# Patient Record
Sex: Female | Born: 1990 | Race: Black or African American | Hispanic: No | Marital: Single | Smoking: Never smoker
Health system: Southern US, Community
[De-identification: ages and names within clinical notes are randomized; demographics above are authoritative.]

## PROBLEM LIST (undated history)

## (undated) DIAGNOSIS — H8109 Meniere's disease, unspecified ear: Secondary | ICD-10-CM

---

## 2013-07-22 ENCOUNTER — Ambulatory Visit (INDEPENDENT_AMBULATORY_CARE_PROVIDER_SITE_OTHER): Payer: 59 | Admitting: Podiatry

## 2013-07-22 ENCOUNTER — Encounter: Payer: Self-pay | Admitting: Podiatry

## 2013-07-22 DIAGNOSIS — M21969 Unspecified acquired deformity of unspecified lower leg: Secondary | ICD-10-CM

## 2013-07-22 DIAGNOSIS — M216X9 Other acquired deformities of unspecified foot: Secondary | ICD-10-CM

## 2013-07-22 DIAGNOSIS — M25579 Pain in unspecified ankle and joints of unspecified foot: Secondary | ICD-10-CM

## 2013-07-22 NOTE — Progress Notes (Signed)
Subjective: 22 y.o. year old female patient presents with her mother complaining of pain on left foot arch and ankle area. Patient performs and on feet much. She danced throughout school years. Patient is wearing thin flat shoes. Patient lives in [place] and comes down to [place] every 2-3 month.   Objective: Dermatologic: No abnormal findings. Vascular: Pedal pulses are all palpable. Orthopedic: Positive of hypermobile first ray. Excess STJ pronation left>right. Excess medial and plantar advancement of TNJ complex. Flattening medial longitudinal arch L>R.  Neurologic: All epicritic and tactile sensations grossly intact. Radiographic examination reveal elevated first ray, short first Metatarsal, STJ excess pronatory subluxation bilateral.  Assessment: STJ hyperpronation. 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 Pain on left foot and ankle.   Treatment: Reviewed clinical findings and available treatment options. Left foot and ankle placed in Ankle brace. Both feet were casted for Orthotics. Discussed all conservative treatment options as well as surgical procedure of STJ arthroereisis and Cotton osteotomy with bone graft.

## 2013-07-29 DIAGNOSIS — M216X9 Other acquired deformities of unspecified foot: Secondary | ICD-10-CM

## 2013-11-18 ENCOUNTER — Encounter: Payer: Self-pay | Admitting: Podiatry

## 2013-11-18 ENCOUNTER — Ambulatory Visit (INDEPENDENT_AMBULATORY_CARE_PROVIDER_SITE_OTHER): Payer: 59 | Admitting: Podiatry

## 2013-11-18 VITALS — BP 130/80 | HR 93 | Ht 63.0 in | Wt 143.0 lb

## 2013-11-18 DIAGNOSIS — M25579 Pain in unspecified ankle and joints of unspecified foot: Secondary | ICD-10-CM

## 2013-11-18 DIAGNOSIS — M216X9 Other acquired deformities of unspecified foot: Secondary | ICD-10-CM

## 2013-11-18 DIAGNOSIS — M21969 Unspecified acquired deformity of unspecified lower leg: Secondary | ICD-10-CM

## 2013-11-18 NOTE — Patient Instructions (Signed)
Seen for painful feet.  Reviewed other treatment options, Hyprocure procedure and Cotton osteotomy with bone graft on left foot. Surgery consent form reviewed.  Rubie will call and set up surgery date.

## 2013-11-18 NOTE — Progress Notes (Signed)
Subjective: Seen for pain on both feet. Used custom orthotics. Still experiencing pain and tingling on feet. She wears shoes that is made for Hyperpronation. This time she has moved to [place] to have her feet corrected.   Objective: Painful feet. Flattened arch with weight bearing. Forefoot varus bilateral.  Assessment: STJ hyperpronation bilateral. Forefoot varus bilateral. Aching feet even after wearing shoes with Custom orthotics.   Plan: Reviewed for STJ Arthroereisis left and Cotton osteotomy with bone graft left. Patient will call and set surgery date.

## 2013-11-24 DIAGNOSIS — Q664 Congenital talipes calcaneovalgus, unspecified foot: Secondary | ICD-10-CM

## 2013-11-24 DIAGNOSIS — Q666 Other congenital valgus deformities of feet: Secondary | ICD-10-CM

## 2013-11-24 HISTORY — PX: OSTEOTOMY: SHX137

## 2013-11-24 HISTORY — PX: OTHER SURGICAL HISTORY: SHX169

## 2013-11-29 ENCOUNTER — Encounter: Payer: Self-pay | Admitting: Podiatry

## 2013-11-29 ENCOUNTER — Ambulatory Visit (INDEPENDENT_AMBULATORY_CARE_PROVIDER_SITE_OTHER): Payer: 59 | Admitting: Podiatry

## 2013-11-29 VITALS — BP 137/74 | HR 99

## 2013-11-29 DIAGNOSIS — M216X9 Other acquired deformities of unspecified foot: Secondary | ICD-10-CM

## 2013-11-29 DIAGNOSIS — M21969 Unspecified acquired deformity of unspecified lower leg: Secondary | ICD-10-CM

## 2013-11-29 NOTE — Patient Instructions (Signed)
First post op visit. Doing well. Gone over crutch handling. Will try right foot surgery this coming week.

## 2013-11-29 NOTE — Progress Notes (Signed)
5 days post op since Cotton osteotomy with graft and STJ arthroereisis left foot. Came in accompanied by her mother and little brother. Patient denies fever or chills.  Stated that she is doing well. No problems with the cast. Using wheeled walker to get around.  Wishes to have the right foot surgery at this time.  Assessment: Left foot doing well following the surgery. Right foot has the same condition as the left foot; Excessive STJ pronation, Sagging midfoot with elevated first ray.   Plan: Consent form reviewed for Cotton osteotomy with bone graft and STJ arthroereisis right foot. Dispensed Crutches and demo done.

## 2013-12-05 DIAGNOSIS — Q664 Congenital talipes calcaneovalgus, unspecified foot: Secondary | ICD-10-CM

## 2013-12-05 DIAGNOSIS — Q666 Other congenital valgus deformities of feet: Secondary | ICD-10-CM

## 2013-12-05 HISTORY — PX: OSTEOTOMY: SHX137

## 2013-12-05 HISTORY — PX: OTHER SURGICAL HISTORY: SHX169

## 2013-12-12 ENCOUNTER — Encounter: Payer: 59 | Admitting: Podiatry

## 2013-12-14 ENCOUNTER — Encounter: Payer: Self-pay | Admitting: Podiatry

## 2013-12-14 ENCOUNTER — Ambulatory Visit (INDEPENDENT_AMBULATORY_CARE_PROVIDER_SITE_OTHER): Payer: 59 | Admitting: Podiatry

## 2013-12-14 DIAGNOSIS — Z9889 Other specified postprocedural states: Secondary | ICD-10-CM

## 2013-12-14 NOTE — Progress Notes (Signed)
Status post 5 days on right, 3 weeks on left, both had Cotton osteotomy with bone graft and STJ arthroereisis.  Patient presented via wheel chair accompanied by mother and brother with both feet casted.  Stated that she needed pain pills one every other days. She had experienced minimum pain. 423 week old left foot cast removed. Wound has healed well. Minimum edema noted.  Left lower limb placed back in fiber glass cast.

## 2013-12-14 NOTE — Patient Instructions (Signed)
Left lower limb cast replaced.  Wound healing is normal without any abnormal findings.

## 2013-12-15 ENCOUNTER — Encounter: Payer: Self-pay | Admitting: *Deleted

## 2013-12-26 ENCOUNTER — Encounter: Payer: Self-pay | Admitting: Podiatry

## 2013-12-26 ENCOUNTER — Ambulatory Visit (INDEPENDENT_AMBULATORY_CARE_PROVIDER_SITE_OTHER): Payer: 59 | Admitting: Podiatry

## 2013-12-26 VITALS — BP 125/84 | HR 92

## 2013-12-26 DIAGNOSIS — Z9889 Other specified postprocedural states: Secondary | ICD-10-CM

## 2013-12-26 NOTE — Progress Notes (Signed)
Post op visit. Patient presents via wheel chair after having done surgery on both feet. 3 weeks post op on right and 4 weeks on left.  Right cast removed and noted incision has healed well without edema or abnormal findings. Noted of maintained correction.  Right lower limb replaced with new cast.  No problem with left cast. We will change in 2 weeks. Patient may use walker or wheel chair.

## 2013-12-26 NOTE — Patient Instructions (Signed)
Right lower limb cast replaced. Surgical wound healed well with maintained correction.  May use walker or wheel chair.

## 2014-01-09 ENCOUNTER — Ambulatory Visit (INDEPENDENT_AMBULATORY_CARE_PROVIDER_SITE_OTHER): Payer: 59 | Admitting: Podiatry

## 2014-01-09 ENCOUNTER — Encounter: Payer: Self-pay | Admitting: Podiatry

## 2014-01-09 VITALS — BP 130/88 | HR 101

## 2014-01-09 DIAGNOSIS — M25579 Pain in unspecified ankle and joints of unspecified foot: Secondary | ICD-10-CM

## 2014-01-09 DIAGNOSIS — M216X9 Other acquired deformities of unspecified foot: Secondary | ICD-10-CM

## 2014-01-09 DIAGNOSIS — Z9889 Other specified postprocedural states: Secondary | ICD-10-CM

## 2014-01-09 DIAGNOSIS — M21969 Unspecified acquired deformity of unspecified lower leg: Secondary | ICD-10-CM

## 2014-01-09 NOTE — Patient Instructions (Signed)
Surgery follow up visit. Left cast removed and placed on CAM walker. Doing well with both feet. Return for right foot cast removal.

## 2014-01-09 NOTE — Progress Notes (Signed)
Patient presents using walker accompanied by her father and younger brother.  Stated that she wasn't having any pain and was not taking any pain medications. Been walking in cast.  6 week post op on left.  Cast removed left, replaced in CAM walker.  Wound healing is good left foot without complications.   Right foot is still in cast without any problems. Will remove cast on next visit.

## 2014-01-16 ENCOUNTER — Ambulatory Visit (INDEPENDENT_AMBULATORY_CARE_PROVIDER_SITE_OTHER): Payer: 59 | Admitting: Podiatry

## 2014-01-16 ENCOUNTER — Encounter: Payer: Self-pay | Admitting: Podiatry

## 2014-01-16 VITALS — BP 130/76 | HR 92

## 2014-01-16 DIAGNOSIS — M21969 Unspecified acquired deformity of unspecified lower leg: Secondary | ICD-10-CM

## 2014-01-16 DIAGNOSIS — M216X9 Other acquired deformities of unspecified foot: Secondary | ICD-10-CM

## 2014-01-16 DIAGNOSIS — M25579 Pain in unspecified ankle and joints of unspecified foot: Secondary | ICD-10-CM

## 2014-01-16 NOTE — Progress Notes (Signed)
6 week on right, 8 weeks on left post op both with Cotton and STJ arthroereisis. Came in using walker, right lower limb in cast, left in CAM walker. Cast removed on right. Wound is well healed without abnormal findings. Corrections maintained.   X-rays taken on both feet. Noted good approximation of graft. No changes from immediate post op.  Assessment: Normal post op wound healing.  Plan: May try regular tennis shoe on left.  Stay in CAM walker on right. Return in 3 weeks.

## 2014-01-16 NOTE — Patient Instructions (Signed)
Both feet are healing as scheduled.  May ambulate in CAM walker on right, may use regular shoe on left. Use walker as needed.  Return in 3 weeks.

## 2014-02-06 ENCOUNTER — Ambulatory Visit (INDEPENDENT_AMBULATORY_CARE_PROVIDER_SITE_OTHER): Payer: 59 | Admitting: Podiatry

## 2014-02-06 ENCOUNTER — Encounter: Payer: Self-pay | Admitting: Podiatry

## 2014-02-06 VITALS — BP 141/86 | HR 97

## 2014-02-06 DIAGNOSIS — M21969 Unspecified acquired deformity of unspecified lower leg: Secondary | ICD-10-CM

## 2014-02-06 NOTE — Progress Notes (Signed)
Walking in CAM walker without assistance. Doing well except some sharp and and some discomfort at times at lateral ankle area.  Well maintained post op bone position and STJ movement.   Assessment: Satisfactory recovery from bilateral foot surgery, Cotton and STJ arthroereisis.   Plan:  Start walking in regular tennis shoes with aid of ace bandage and increase ambulation in increment. Also use NSAIA as needed.  Return in one month.

## 2014-02-06 NOTE — Patient Instructions (Signed)
Post op check. Doing well. Ready to use regular shoe and increase ambulation.  Return in one month.

## 2014-02-13 ENCOUNTER — Telehealth: Payer: Self-pay | Admitting: *Deleted

## 2014-02-13 NOTE — Telephone Encounter (Signed)
Dr. Raynald KempSheard, Spero CurbAnny Ferguson has called this afternoon and wants to take some physical therapy so that makes sure she is walking correctly... [facility]

## 2014-03-14 ENCOUNTER — Encounter: Payer: Self-pay | Admitting: Podiatry

## 2014-03-14 ENCOUNTER — Ambulatory Visit (INDEPENDENT_AMBULATORY_CARE_PROVIDER_SITE_OTHER): Payer: 59 | Admitting: Podiatry

## 2014-03-14 VITALS — BP 126/80 | HR 95

## 2014-03-14 DIAGNOSIS — Z9889 Other specified postprocedural states: Secondary | ICD-10-CM

## 2014-03-14 NOTE — Progress Notes (Signed)
Subjective:  4 months following Cotton and STJ implant. Walking in Tennis shoes with Ace wrap.  Objective:  Range of motion is good on right, with well maintained correction at medial arch on both feet.  Some stiffness at STJ on left foot.  Able to walk in Tennis shoes without difficulty.  Assessment: Satisfactory post op progress. Maintained medial column correction with improved foot pain bilateral.  Plan: May change ace wrap with elastic ankle support.  Use orthotics in tennis shoes. No limitation in activity as long as it is well tolerated.  Return as needed.

## 2014-03-14 NOTE — Patient Instructions (Addendum)
4 month follow up. Doi ng well and made good progress. May change ace wrap with elastic ankle support.  Use orthotics in tennis shoes. No limitation in activity as long as it is well tolerated.  Return as needed.

## 2014-11-14 ENCOUNTER — Encounter: Payer: Self-pay | Admitting: Podiatry

## 2014-11-14 ENCOUNTER — Ambulatory Visit (INDEPENDENT_AMBULATORY_CARE_PROVIDER_SITE_OTHER): Payer: 59 | Admitting: Podiatry

## 2014-11-14 VITALS — BP 130/77 | HR 83

## 2014-11-14 DIAGNOSIS — Z9889 Other specified postprocedural states: Secondary | ICD-10-CM

## 2014-11-14 NOTE — Patient Instructions (Signed)
Seen for pain in left ankle when standing prolonged period. May benefit from increased exercise and range of motion. Return as needed.

## 2014-11-14 NOTE — Progress Notes (Signed)
23 year old female presents complaining of left ankle pain after been standing on feet long period. Also when moving certain way. As she standing more she noticed she is standing more on side and more pain on ankle. She got new pair shoes to help out with the left foot pain.   Objective: Normal range of STJ motion in pronation and supination.  Plantar flexed first ray maintained.  Assessment: Stiffness and pain with prolonged standing on left.  Plan: Advised to increase exercise and range of motion exercise.  Return as needed.

## 2014-11-17 ENCOUNTER — Ambulatory Visit: Payer: 59 | Admitting: Podiatry

## 2015-01-09 ENCOUNTER — Other Ambulatory Visit (HOSPITAL_COMMUNITY): Payer: Self-pay | Admitting: Ophthalmology

## 2015-01-09 DIAGNOSIS — H05223 Edema of bilateral orbit: Secondary | ICD-10-CM

## 2015-01-09 DIAGNOSIS — H04023 Chronic dacryoadenitis, bilateral lacrimal gland: Secondary | ICD-10-CM

## 2015-01-15 ENCOUNTER — Ambulatory Visit (HOSPITAL_COMMUNITY): Payer: Self-pay

## 2015-01-19 ENCOUNTER — Ambulatory Visit (HOSPITAL_COMMUNITY)
Admission: RE | Admit: 2015-01-19 | Discharge: 2015-01-19 | Disposition: A | Discharge status: Home / Self Care | Payer: BC Managed Care – PPO | Source: Ambulatory Visit | Attending: Ophthalmology | Admitting: Ophthalmology

## 2015-01-19 ENCOUNTER — Other Ambulatory Visit (HOSPITAL_COMMUNITY): Payer: Self-pay | Admitting: Ophthalmology

## 2015-01-19 ENCOUNTER — Encounter (HOSPITAL_COMMUNITY): Payer: Self-pay

## 2015-01-19 DIAGNOSIS — H04023 Chronic dacryoadenitis, bilateral lacrimal gland: Secondary | ICD-10-CM | POA: Diagnosis not present

## 2015-01-19 DIAGNOSIS — H05223 Edema of bilateral orbit: Secondary | ICD-10-CM | POA: Insufficient documentation

## 2015-01-19 MED ORDER — IOHEXOL 300 MG/ML  SOLN
100.0000 mL | Freq: Once | INTRAMUSCULAR | Status: AC | PRN
  Administered 2015-01-19: 100 mL via INTRAVENOUS

## 2016-11-07 ENCOUNTER — Encounter (HOSPITAL_BASED_OUTPATIENT_CLINIC_OR_DEPARTMENT_OTHER): Payer: Self-pay | Admitting: Emergency Medicine

## 2016-11-07 ENCOUNTER — Emergency Department (HOSPITAL_BASED_OUTPATIENT_CLINIC_OR_DEPARTMENT_OTHER): Payer: BC Managed Care – PPO

## 2016-11-07 ENCOUNTER — Emergency Department (HOSPITAL_BASED_OUTPATIENT_CLINIC_OR_DEPARTMENT_OTHER)
Admission: EM | Admit: 2016-11-07 | Discharge: 2016-11-07 | Disposition: A | Discharge status: Home / Self Care | Payer: BC Managed Care – PPO | Attending: Emergency Medicine | Admitting: Emergency Medicine

## 2016-11-07 DIAGNOSIS — M545 Low back pain: Secondary | ICD-10-CM | POA: Diagnosis not present

## 2016-11-07 DIAGNOSIS — M436 Torticollis: Secondary | ICD-10-CM | POA: Diagnosis not present

## 2016-11-07 DIAGNOSIS — Y9389 Activity, other specified: Secondary | ICD-10-CM | POA: Diagnosis not present

## 2016-11-07 DIAGNOSIS — Y999 Unspecified external cause status: Secondary | ICD-10-CM | POA: Diagnosis not present

## 2016-11-07 DIAGNOSIS — S3992XA Unspecified injury of lower back, initial encounter: Secondary | ICD-10-CM | POA: Diagnosis present

## 2016-11-07 DIAGNOSIS — Z79899 Other long term (current) drug therapy: Secondary | ICD-10-CM | POA: Insufficient documentation

## 2016-11-07 DIAGNOSIS — Y92410 Unspecified street and highway as the place of occurrence of the external cause: Secondary | ICD-10-CM | POA: Insufficient documentation

## 2016-11-07 LAB — PREGNANCY, URINE: Preg Test, Ur: NEGATIVE

## 2016-11-07 MED ORDER — CYCLOBENZAPRINE HCL 10 MG PO TABS: 10.0000 mg | ORAL_TABLET | Freq: Two times a day (BID) | ORAL | 0 refills | Status: AC | PRN

## 2016-11-07 MED ORDER — IBUPROFEN 800 MG PO TABS: 800.0000 mg | ORAL_TABLET | Freq: Three times a day (TID) | ORAL | 0 refills | Status: DC
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
## 2017-03-11 ENCOUNTER — Ambulatory Visit (INDEPENDENT_AMBULATORY_CARE_PROVIDER_SITE_OTHER): Payer: BLUE CROSS/BLUE SHIELD | Admitting: Podiatry

## 2017-03-11 ENCOUNTER — Encounter: Payer: Self-pay | Admitting: Podiatry

## 2017-03-11 VITALS — Ht 64.0 in | Wt 160.0 lb

## 2017-03-11 DIAGNOSIS — M25579 Pain in unspecified ankle and joints of unspecified foot: Secondary | ICD-10-CM

## 2017-03-11 DIAGNOSIS — M216X2 Other acquired deformities of left foot: Secondary | ICD-10-CM

## 2017-03-11 DIAGNOSIS — M216X1 Other acquired deformities of right foot: Secondary | ICD-10-CM | POA: Diagnosis not present

## 2017-03-11 DIAGNOSIS — Z9889 Other specified postprocedural states: Secondary | ICD-10-CM

## 2017-03-11 DIAGNOSIS — M216X9 Other acquired deformities of unspecified foot: Secondary | ICD-10-CM

## 2017-03-11 DIAGNOSIS — M21969 Unspecified acquired deformity of unspecified lower leg: Secondary | ICD-10-CM | POA: Diagnosis not present

## 2017-03-11 NOTE — Patient Instructions (Addendum)
Both feet casted for Orthotics. New x-ray show normal progression. Grafted bone fused and implant is still in place. Will call when orthotics are ready.

## 2017-03-11 NOTE — Progress Notes (Signed)
Subjective: S/P Bilateral foot surgery. 12/09/13  on right, 11/22/13 on left, both had Cotton osteotomy with bone graft and STJ arthroereisis.  Stated that her feet are doing well and functioning with good improvement since the surgery.   She had her last orthotics 3 years ago and in need of a new pair.   High arched supinated foot. Rearfoot varus with reduced STJ pronation. Plantar flexed first ray with minimum sagittal plane motion bilateral.  Radiographic examination reveal maintained correction with bone graft at the first Cuneiform and STJ arthroereisis in place in Sinus Tarsi. No disturbance noted.  Assessment: Supinated foot with reduced compensatory STJ pronation. Reduced first ray angular deformity.  Plan: Both feet casted for custom orthotics.

## 2017-07-07 ENCOUNTER — Encounter (HOSPITAL_COMMUNITY): Payer: Self-pay | Admitting: *Deleted

## 2017-07-07 ENCOUNTER — Emergency Department (HOSPITAL_COMMUNITY): Payer: BC Managed Care – PPO

## 2017-07-07 ENCOUNTER — Emergency Department (HOSPITAL_COMMUNITY)
Admission: EM | Admit: 2017-07-07 | Discharge: 2017-07-07 | Disposition: A | Discharge status: Home / Self Care | Payer: BC Managed Care – PPO | Attending: Emergency Medicine | Admitting: Emergency Medicine

## 2017-07-07 DIAGNOSIS — Y92410 Unspecified street and highway as the place of occurrence of the external cause: Secondary | ICD-10-CM | POA: Diagnosis not present

## 2017-07-07 DIAGNOSIS — S199XXA Unspecified injury of neck, initial encounter: Secondary | ICD-10-CM | POA: Diagnosis present

## 2017-07-07 DIAGNOSIS — M545 Low back pain: Secondary | ICD-10-CM | POA: Diagnosis not present

## 2017-07-07 DIAGNOSIS — Y999 Unspecified external cause status: Secondary | ICD-10-CM | POA: Insufficient documentation

## 2017-07-07 DIAGNOSIS — Y9389 Activity, other specified: Secondary | ICD-10-CM | POA: Diagnosis not present

## 2017-07-07 DIAGNOSIS — S161XXA Strain of muscle, fascia and tendon at neck level, initial encounter: Secondary | ICD-10-CM | POA: Diagnosis not present

## 2017-07-07 HISTORY — DX: Meniere's disease, unspecified ear: H81.09

## 2017-07-07 MED ORDER — IBUPROFEN 400 MG PO TABS
600.0000 mg | ORAL_TABLET | Freq: Once | ORAL | Status: AC
  Administered 2017-07-07: 600 mg via ORAL
  Filled 2017-07-07: qty 1
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
## 2017-07-07 NOTE — ED Notes (Signed)
ED Provider at bedside. 
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
